# Patient Record
Sex: Male | Born: 1955 | Race: White | Hispanic: No | Marital: Married | Smoking: Never smoker
Health system: Southern US, Community
[De-identification: ages and names within clinical notes are randomized; demographics above are authoritative.]

## PROBLEM LIST (undated history)

## (undated) DIAGNOSIS — I509 Heart failure, unspecified: Secondary | ICD-10-CM

## (undated) DIAGNOSIS — J45909 Unspecified asthma, uncomplicated: Secondary | ICD-10-CM

---

## 2015-02-12 ENCOUNTER — Emergency Department (HOSPITAL_COMMUNITY): Payer: Medicare HMO

## 2015-02-12 ENCOUNTER — Encounter (HOSPITAL_COMMUNITY): Payer: Self-pay | Admitting: Physical Medicine and Rehabilitation

## 2015-02-12 ENCOUNTER — Emergency Department (HOSPITAL_COMMUNITY)
Admission: EM | Admit: 2015-02-12 | Discharge: 2015-02-12 | Disposition: A | Discharge status: Home / Self Care | Payer: Medicare HMO | Attending: Emergency Medicine | Admitting: Emergency Medicine

## 2015-02-12 DIAGNOSIS — Y998 Other external cause status: Secondary | ICD-10-CM | POA: Diagnosis not present

## 2015-02-12 DIAGNOSIS — J45909 Unspecified asthma, uncomplicated: Secondary | ICD-10-CM | POA: Insufficient documentation

## 2015-02-12 DIAGNOSIS — S20211A Contusion of right front wall of thorax, initial encounter: Secondary | ICD-10-CM | POA: Insufficient documentation

## 2015-02-12 DIAGNOSIS — Y9389 Activity, other specified: Secondary | ICD-10-CM | POA: Insufficient documentation

## 2015-02-12 DIAGNOSIS — I509 Heart failure, unspecified: Secondary | ICD-10-CM | POA: Diagnosis not present

## 2015-02-12 DIAGNOSIS — S29001A Unspecified injury of muscle and tendon of front wall of thorax, initial encounter: Secondary | ICD-10-CM | POA: Diagnosis present

## 2015-02-12 DIAGNOSIS — Y9289 Other specified places as the place of occurrence of the external cause: Secondary | ICD-10-CM | POA: Insufficient documentation

## 2015-02-12 HISTORY — DX: Unspecified asthma, uncomplicated: J45.909

## 2015-02-12 HISTORY — DX: Heart failure, unspecified: I50.9

## 2015-02-12 MED ORDER — TRAMADOL HCL 50 MG PO TABS: 50.0000 mg | ORAL_TABLET | Freq: Four times a day (QID) | ORAL | Status: AC | PRN

## 2015-02-12 MED ORDER — MORPHINE SULFATE 4 MG/ML IJ SOLN
4.0000 mg | Freq: Once | INTRAMUSCULAR | Status: AC
  Administered 2015-02-12: 4 mg via INTRAVENOUS
  Filled 2015-02-12: qty 1

## 2015-02-12 MED ORDER — ONDANSETRON HCL 4 MG/2ML IJ SOLN
4.0000 mg | Freq: Once | INTRAMUSCULAR | Status: AC
  Administered 2015-02-12: 4 mg via INTRAVENOUS
  Filled 2015-02-12: qty 2
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
## 2015-02-12 NOTE — ED Notes (Signed)
Pt returned to exam room. C-collar remains in place. Vital signs stable. Pt denies pain. He remains alert and oriented x4. NAD.
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
## 2015-02-12 NOTE — ED Notes (Signed)
C-spine cleared by CT scan and EDP. C-collar removed.
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
